# Patient Record
Sex: Female | Born: 1955 | Race: White | Hispanic: No | Marital: Married | Smoking: Never smoker
Health system: Southern US, Community
[De-identification: ages and names within clinical notes are randomized; demographics above are authoritative.]

## PROBLEM LIST (undated history)

## (undated) DIAGNOSIS — J329 Chronic sinusitis, unspecified: Secondary | ICD-10-CM

## (undated) DIAGNOSIS — L509 Urticaria, unspecified: Secondary | ICD-10-CM

## (undated) HISTORY — DX: Urticaria, unspecified: L50.9

## (undated) HISTORY — DX: Chronic sinusitis, unspecified: J32.9

---

## 1997-10-27 ENCOUNTER — Ambulatory Visit (HOSPITAL_COMMUNITY): Admission: RE | Admit: 1997-10-27 | Discharge: 1997-10-27 | Payer: Self-pay | Admitting: *Deleted

## 1997-11-02 ENCOUNTER — Ambulatory Visit (HOSPITAL_COMMUNITY): Admission: RE | Admit: 1997-11-02 | Discharge: 1997-11-02 | Payer: Self-pay | Admitting: *Deleted

## 1997-12-03 ENCOUNTER — Ambulatory Visit (HOSPITAL_COMMUNITY): Admission: RE | Admit: 1997-12-03 | Discharge: 1997-12-03 | Payer: Self-pay | Admitting: *Deleted

## 1997-12-28 ENCOUNTER — Ambulatory Visit (HOSPITAL_BASED_OUTPATIENT_CLINIC_OR_DEPARTMENT_OTHER): Admission: RE | Admit: 1997-12-28 | Discharge: 1997-12-28 | Payer: Self-pay | Admitting: *Deleted

## 1998-07-07 ENCOUNTER — Other Ambulatory Visit: Admission: RE | Admit: 1998-07-07 | Discharge: 1998-07-07 | Payer: Self-pay | Admitting: Gynecology

## 2002-02-20 ENCOUNTER — Other Ambulatory Visit: Admission: RE | Admit: 2002-02-20 | Discharge: 2002-02-20 | Payer: Self-pay | Admitting: Gynecology

## 2004-06-06 ENCOUNTER — Other Ambulatory Visit: Admission: RE | Admit: 2004-06-06 | Discharge: 2004-06-06 | Payer: Self-pay | Admitting: Gynecology

## 2006-10-29 ENCOUNTER — Ambulatory Visit (HOSPITAL_COMMUNITY): Admission: RE | Admit: 2006-10-29 | Discharge: 2006-10-30 | Payer: Self-pay | Admitting: Neurosurgery

## 2006-11-30 ENCOUNTER — Encounter: Admission: RE | Admit: 2006-11-30 | Discharge: 2006-11-30 | Payer: Self-pay | Admitting: Obstetrics and Gynecology

## 2010-10-04 NOTE — Op Note (Signed)
Abigail Fields, Abigail Fields             ACCOUNT NO.:  [id number]   MEDICAL RECORD NO.:  [id number]          PATIENT TYPE:  OIB   LOCATION:  5149                         FACILITY:  [facility]   PHYSICIAN:  Cristi Loron, M.D.DATE OF BIRTH:  08/13/1955   DATE OF PROCEDURE:  10/29/2006  DATE OF DISCHARGE:  10/30/2006                               OPERATIVE REPORT   BRIEF HISTORY:  The patient is a 55 year old white female who has  suffered from neck and right arm pain.  She has failed medical  management.  She is worked up with a cervical MRI, which demonstrated a  large herniated disk at C6-7 on the right.  The patient's signs and  symptoms at physical exam were consistent with a right C7 radiculopathy.  I discussed the various treatment options with the patient including  surgery.  The patient is aware of the risks, benefits and alternatives  of surgery, she decided to proceed with C6-7 anterior cervical  diskectomy and fusion plating, placement of interbody prosthesis.   PREOPERATIVE DIAGNOSES:  1. 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  2. Spinal stenosis.  3. Cervical radiculopathy.  4. Cervicalgia.   POSTOPERATIVE DIAGNOSES:  1. 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  2. Spinal stenosis.  3. Cervical radiculopathy.  4. Cervicalgia.   PROCEDURE:  C6-7 extensive anterior cervical diskectomy/decompression;  C6-7 anterior interbody arthrodesis with local morselized autograft  bone; cervical C6-7 interbody prosthesis (Alphatec PEEK interbody  prosthesis); anterior cervical plating at C6-7 with Codman Slim-Loc  anterior cervical plate and screws).   SURGEON:  Cristi Loron, M.D.   ASSISTANT:  Coletta Memos, M.D.   ANESTHESIA:  General endotracheal.   ESTIMATED BLOOD LOSS:  50 mL.   SPECIMENS:  None.   DRAINS:  None.   COMPLICATIONS:  None.   DESCRIPTION OF PROCEDURE:  The patient is brought to the operating room  by the anesthesia team.  General endotracheal anesthesia was induced.  The patient  remained in the supine position.  A roll was placed under  her shoulders and placed her neck in slight extension.  Her anterior  cervical region was then prepared with Betadine scrub with Betadine  solution and sterile drapes were applied.  I then injected the area to  be incised with Marcaine with epinephrine solution.  I used the scalpel  to make a transverse incision in the patient's left anterior neck.  I  used the Metzenbaum scissors to divide the platysma muscle and then to  dissect medial to sternocleidomastoid muscle to the remaining carotid  artery.  I carefully dissected down towards the anterior cervical spine  carefully identifying the esophagus and retracting it medially.  We then  cleared the soft tissue from the anterior cervical spine and inserted a  spinal needle until exposing the intervertebral disk space.  We then  obtained an intraoperative radiograph to confirm our location.   We then used electrocautery to detach the medial border of the longus  coli muscle bilaterally from C6-7 intervertebral disk space.  We  inserted the [facility] self-retaining retractor underneath the longus coli  muscle bilaterally to provide exposure.  We incised the C6-7  intervertebral disk and performed a partial intravertebral diskectomy  using the pituitary forceps and the Karlin curettes.  We inserted  distractors through the C6 and C7 disk and then distracted the  interspace and then used a high speed drill to decorticate the vertebral  end plates at remainder of C6-7 intervertebral disk to drill away some  posterior spondylosis and to trim out the posterior longitudinal  ligament.  We then incised and trimmed out the ligament with the  arachnoid knife and then removed it with the Kerrison punch and then  cutting the vertebral end plates, decompressing the thecal sac.  We then  performed a foraminotomy and decompression of the nerve roots.  Of note,  we encountered expected large herniated  disk on the right, compressing  the right C7 nerve root.   Having completed decompression at C6-7, we now turned our attention to  the arthrodesis.  We used the trial spacers and determined using 5 mm  small Alphatec interbody PEEK prosthesis.  We prefilled this prosthesis  with the combination of Vitoss bone graft extender and local morselized  autograft bone.  We obtained the decompression.  We then inserted the  prosthesis into the distracted interspace and then removed the  distraction screws.  There was good snug fit of the prosthesis in the  interspace.   We now turned our attention to the anterior spinal instrumentation.  We  used a high speed drill to remove some ventral spondylosis from the  vertebral end plates at Z6-1 so the plate would lay down flat, selecting  the appropriate length with the Codman Slim-Loc anterior cervical plate  and laid it on the anterior aspect of vertebral bodies of C6 and C7.  We  then drilled two 12-mm holes at C6, two at C7 and secured the plate to  the vertebral bodies by placing two 12 mm self-tapping screws at C6 to  C7.  Obtained an intraoperative radiograph but because of the patient's  body habitus, we could not see the plate and screws very well but they  looked good in view though.  We, therefore, secured these screws in  plate by locking each cam, completing the instrumentation.   We then obtained hemostasis using bipolar electrocautery.  We irrigated  the wound out with bacitracin solution and removed the retractor.  We  then inspected the esophagus for any damage, there was none apparent.  We then reapproximated the patient's platysma muscle with interrupted 3-  0 Vicryl sutures, the subcutaneous tissue with interrupted 3-0 Vicryl  suture and the skin with Steri-Strips and benzoin.  The wound was then  coated with bacitracin ointment, a sterile dressing applied, the dressing removed.  The patient was subsequently extubated by the   anesthesia team and transported to the postanesthesia care unit in  stable condition.  All sponge, instrument and needle counts were correct  in this case.      Cristi Loron, M.D.  Electronically Signed     JDJ/MEDQ  D:  10/30/2006  T:  10/31/2006  Job:  [id number]

## 2011-03-09 LAB — CBC
HCT: 45.5
Hemoglobin: 15.4 — ABNORMAL HIGH
MCHC: 33.8
MCV: 93.3
RBC: 4.88

## 2011-07-04 ENCOUNTER — Other Ambulatory Visit: Payer: Self-pay | Admitting: Obstetrics and Gynecology

## 2011-07-04 DIAGNOSIS — N63 Unspecified lump in unspecified breast: Secondary | ICD-10-CM

## 2011-07-11 ENCOUNTER — Other Ambulatory Visit: Payer: Self-pay

## 2011-07-19 ENCOUNTER — Ambulatory Visit
Admission: RE | Admit: 2011-07-19 | Discharge: 2011-07-19 | Disposition: A | Discharge status: Home / Self Care | Payer: Managed Care, Other (non HMO) | Source: Ambulatory Visit | Attending: Obstetrics and Gynecology | Admitting: Obstetrics and Gynecology

## 2011-07-19 DIAGNOSIS — N63 Unspecified lump in unspecified breast: Secondary | ICD-10-CM

## 2014-04-22 ENCOUNTER — Other Ambulatory Visit (HOSPITAL_COMMUNITY): Payer: Self-pay | Admitting: Obstetrics and Gynecology

## 2014-04-22 DIAGNOSIS — R1032 Left lower quadrant pain: Secondary | ICD-10-CM

## 2018-03-25 ENCOUNTER — Other Ambulatory Visit: Payer: Self-pay | Admitting: Obstetrics and Gynecology

## 2018-03-25 DIAGNOSIS — R928 Other abnormal and inconclusive findings on diagnostic imaging of breast: Secondary | ICD-10-CM

## 2018-03-28 ENCOUNTER — Ambulatory Visit
Admission: RE | Admit: 2018-03-28 | Discharge: 2018-03-28 | Disposition: A | Discharge status: Home / Self Care | Payer: Managed Care, Other (non HMO) | Source: Ambulatory Visit | Attending: Obstetrics and Gynecology | Admitting: Obstetrics and Gynecology

## 2018-03-28 ENCOUNTER — Ambulatory Visit: Payer: Managed Care, Other (non HMO)

## 2018-03-28 DIAGNOSIS — R928 Other abnormal and inconclusive findings on diagnostic imaging of breast: Secondary | ICD-10-CM

## 2019-03-12 ENCOUNTER — Encounter: Payer: Self-pay | Admitting: Allergy and Immunology

## 2019-03-12 ENCOUNTER — Other Ambulatory Visit: Payer: Self-pay

## 2019-03-12 ENCOUNTER — Ambulatory Visit (INDEPENDENT_AMBULATORY_CARE_PROVIDER_SITE_OTHER): Payer: Managed Care, Other (non HMO) | Admitting: Allergy and Immunology

## 2019-03-12 VITALS — BP 152/80 | HR 76 | Temp 97.9°F | Resp 16 | Ht 64.0 in | Wt 166.0 lb

## 2019-03-12 DIAGNOSIS — J3089 Other allergic rhinitis: Secondary | ICD-10-CM

## 2019-03-12 DIAGNOSIS — J324 Chronic pansinusitis: Secondary | ICD-10-CM | POA: Diagnosis not present

## 2019-03-12 DIAGNOSIS — T7840XA Allergy, unspecified, initial encounter: Secondary | ICD-10-CM

## 2019-03-12 DIAGNOSIS — B999 Unspecified infectious disease: Secondary | ICD-10-CM

## 2019-03-12 MED ORDER — EPINEPHRINE 0.3 MG/0.3ML IJ SOAJ: 0.3000 mg | INTRAMUSCULAR | 1 refills | Status: AC | PRN

## 2019-03-12 NOTE — Patient Instructions (Addendum)
  1.  Allergen avoidance measures - weeds / lettuce  2.  Treat and prevent inflammation:   A.  OTC budesonide-1 spray each nostril 3 times a week  3.  Treat and prevent reflux:   A.  OTC omeprazole 20 mg - 1 tablet 1 time per day  4.  If needed:   A.  Nasal saline  B.  OTC antihistamine -cetirizine/loratadine  C.  Auvi-Q 0.3, Benadryl, MD/ER evaluation for allergic reaction  5.  Blood - CBC w/diff, IgA/G/M, antipneumococcal antibodies, antitetanus antibody, lettuce IgE.  6.  Return to clinic in 4 weeks or earlier if problem  7.  Obtain fall flu vaccine (and Covid vaccine)

## 2019-03-12 NOTE — Progress Notes (Addendum)
[place] - [place] - [place] - [place] - [place]   NEW PATIENT NOTE  Referring Provider: No ref. provider found Primary Provider: Hal Morales, NP Date of office visit: 03/12/2019    Subjective:   Chief Complaint:  Abigail Fields (DOB: 1955-06-13) is a 63 y.o. female who presents to the clinic on 03/12/2019 with a chief complaint of Allergic Reaction and Urticaria .  HPI: Abigail Fields presents to this clinic in evaluation of airway issues and a recent allergic reaction.  She has a long history of airway issues that have required 2 sinus surgeries with her most recent sinus surgery occurring in May 2017.  She has recurrent bouts of sinus infections that require antibiotic administration a few times per year and most recently she required 30 days of mupirocin nasal wash prescribed about 1 year ago.  She has intermittent bouts of nasal congestion and rhinorrhea and some itchy watery eyes especially if she has outdoor exposure.  She also has a chronic head pressure issue on her right side that never resolves.  She takes Tylenol about 1 or 2 times per week for this issue which does appear to help somewhat.  This headache is pressure-like and is not associated with any scotoma or dizziness or other neurological symptoms.  She has been checked for a CSF fluid leak and apparently that is not an issue.  She has constant postnasal drip and throat clearing.  She had a recent CT scan in 2019 which did identify some level of chronic sinusitis for which she was treated with that mupirocin irrigation.  She tells me that she requires intermittent steroid administration with her most recent steroid administration occurring in August 2020.  In addition, she does get coughing spells in association with her sneezing spells but does not develop any wheezing or shortness of breath.  She does not have a history of ugly sputum production or chest pain or chest tightness.  There is no obvious provoking  factor giving rise to these issues.  She did not have a history of childhood asthma.  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    She has had problems with Nasonex causing epistaxis.  She does have reflux with regurgitation very high in her chest about 1 time per week that she does not treat.  She remains away from caffeine consumption and chocolate consumption.  About 1 week ago she ate a lettuce and [place] and bread sandwich and within 20 minutes developed an intensely itchy throat, sneezing, clear rhinorrhea, trouble breathing, coughing spells that lasted about 15 minutes after consuming of Benadryl.  She had no other associated systemic or constitutional symptoms.  Past Medical History:  Diagnosis Date  . Recurrent sinusitis   . Urticaria     Past Surgical History:  Procedure Laterality Date  . ANKLE SURGERY    . APPENDECTOMY    . CERVICAL SPINE SURGERY     c4-c5 fusion  . SINOSCOPY    . VAGINAL HYSTERECTOMY      Allergies as of 03/12/2019      Reactions   Metronidazole Other (See Comments), Rash   Moxifloxacin Other (See Comments), Rash      Medication List      ANTIHISTAMINE PO Take by mouth. Zyrtec/Claritin/Benadryl   calcium carbonate 1500 (600 Ca) MG Tabs tablet Commonly known as: OSCAL Take 600 mg of elemental calcium by mouth 2 (two) times daily with a meal.  cholecalciferol 25 MCG (1000 UT) tablet Commonly known as: VITAMIN D3 Take 1,000 Units by mouth daily.   metoprolol succinate 50 MG 24 hr tablet Commonly known as: TOPROL-XL Take 50 mg by mouth daily. Take with or immediately following a meal.   VAGIFEM VA Place 0.02 mg vaginally daily.   vitamin C 250 MG tablet Commonly known as: ASCORBIC ACID Take 500 mg by mouth daily.       Review of systems negative except as noted in HPI / PMHx or noted below:  Review of Systems  Constitutional: Negative.   HENT: Negative.   Eyes: Negative.    Respiratory: Negative.   Cardiovascular: Negative.   Gastrointestinal: Negative.   Genitourinary: Negative.   Musculoskeletal: Negative.   Skin: Negative.   Neurological: Negative.   Endo/Heme/Allergies: Negative.   Psychiatric/Behavioral: Negative.     Family History  Problem Relation Age of Onset  . Osteoporosis Mother   . Parkinson's disease Mother   . Heart attack Father   . Stroke Father   . Allergic rhinitis Father     Social History   Socioeconomic History  . Marital status: Married    Spouse name: Not on file  . Number of children: Not on file  . Years of education: Not on file  . Highest education level: Not on file  Occupational History  . Not on file  Social Needs  . Financial resource strain: Not on file  . Food insecurity    Worry: Not on file    Inability: Not on file  . Transportation needs    Medical: Not on file    Non-medical: Not on file  Tobacco Use  . Smoking status: Never Smoker  . Smokeless tobacco: Never Used  Substance and Sexual Activity  . Alcohol use: Never    Frequency: Never  . Drug use: Never  . Sexual activity: Not on file  Lifestyle  . Physical activity    Days per week: Not on file    Minutes per session: Not on file  . Stress: Not on file  Relationships  . Social Musician on phone: Not on file    Gets together: Not on file    Attends religious service: Not on file    Active member of club or organization: Not on file    Attends meetings of clubs or organizations: Not on file    Relationship status: Not on file  . Intimate partner violence    Fear of current or ex partner: Not on file    Emotionally abused: Not on file    Physically abused: Not on file    Forced sexual activity: Not on file  Other Topics Concern  . Not on file  Social History Narrative  . Not on file    Environmental and Social history  Lives in a house with a dry environment, a dog located outside the household, no carpet in the  bedroom, no plastic on the bed, plastic on the pillow, no smoking ongoing with inside the household.  She works as a Engineer, civil (consulting).  Objective:   Vitals:   03/12/19 1341 03/12/19 1505  BP: (!) 164/98 (!) 152/80  Pulse: 76   Resp: 16   Temp: 97.9 F (36.6 C)   SpO2: 98%    Height: 5\' 4"  (162.6 cm) Weight: 166 lb (75.3 kg)  Physical Exam Constitutional:      Appearance: She is not diaphoretic.  HENT:     Head: Normocephalic. No  right periorbital erythema or left periorbital erythema.     Right Ear: Tympanic membrane, ear canal and external ear normal.     Left Ear: Tympanic membrane, ear canal and external ear normal.     Nose: Nose normal. No mucosal edema or rhinorrhea.     Mouth/Throat:     Pharynx: No oropharyngeal exudate.  Eyes:     General: Lids are normal.     Conjunctiva/sclera: Conjunctivae normal.     Pupils: Pupils are equal, round, and reactive to light.  Neck:     Thyroid: No thyromegaly.     Trachea: Trachea normal. No tracheal deviation.  Cardiovascular:     Rate and Rhythm: Normal rate and regular rhythm.     Heart sounds: Normal heart sounds, S1 normal and S2 normal. No murmur.  Pulmonary:     Effort: Pulmonary effort is normal. No respiratory distress.     Breath sounds: No stridor. No wheezing or rales.  Chest:     Chest wall: No tenderness.  Abdominal:     General: There is no distension.     Palpations: Abdomen is soft. There is no mass.     Tenderness: There is no abdominal tenderness. There is no guarding or rebound.  Musculoskeletal:        General: No tenderness.  Lymphadenopathy:     Head:     Right side of head: No tonsillar adenopathy.     Left side of head: No tonsillar adenopathy.     Cervical: No cervical adenopathy.  Skin:    Coloration: Skin is not pale.     Findings: No erythema or rash.     Nails: There is no clubbing.   Neurological:     Mental Status: She is alert.     Diagnostics: Allergy skin tests were performed.  She  demonstrated slight hypersensitivity against weed.  Results of blood tests obtained 16 February 2018 identifies IgE 102 IU/mL, no significant titer of IgE antibodies directed against multiple aeroallergens other than pigweed at 0.93 KU/L and an IgE antibody directed against egg at 0.12 KU/L.  It should be noted that she has never had a problem with consumption of egg.  Results of a sinus CT scan obtained 09 April 2018 identifies the following:  Frontal: Mild mucosal thickening along the frontal sinus floors.  Frontal sinuses are extensively pneumatized. No layering fluid.    Ethmoid: Previous bilateral ethmoidectomy. Mucosal thickening in the  ethmoid sinus regions.    Maxillary: Mucosal thickening along the maxillary sinus floors.  Layering fluid in both maxillary sinuses. Bilateral functional  endoscopic sinus surgery with nasoantral windows.    Sphenoid: Normal    Right ostiomeatal unit: Previous functional endoscopic sinus  surgery. Wide communication.    Left ostiomeatal unit: Previous functional endoscopic sinus surgery.  Wide communication.    Nasal passages: Patent. Intact nasal septum is midline.    Anatomy: Some sinus extension superior to the anterior ethmoid  notches. Those portions show opacification. Symmetric and intact  olfactory grooves and fovea ethmoidalis, Keros II (4-20mm) Sellar  sphenoid pneumatization pattern. No dehiscence of carotid or optic  canals. No onodi cell.    Assessment and Plan:    1. Perennial allergic rhinitis   2. Chronic pansinusitis   3. Recurrent infections   4. Allergic reaction, initial encounter     1.  Allergen avoidance measures -weeds / lettuce  2.  Treat and prevent inflammation:   A.  OTC budesonide-1 spray each nostril 3  times a week  3.  Treat and prevent reflux:   A.  OTC omeprazole 20 mg - 1 tablet 1 time per day  4.  If needed:   A.  Nasal saline  B.  OTC antihistamine  -cetirizine/loratadine  C.  Auvi-Q 0.3, Benadryl, MD/ER evaluation for allergic reaction  5.  Blood - CBC w/diff, IgA/G/M, antipneumococcal antibodies, antitetanus antibody, lettuce IgE.  6.  Return to clinic in 4 weeks or earlier if problem  7.  Obtain fall flu vaccine (and Covid vaccine)  Abigail JoinerRebecca appears to have a dysfunctional upper airway with inflammation and recurrent infection which may be based partially on her atopic phenotype but also may have a contribution from reflux induced respiratory disease and there may also be a defect in her immune system.  We will address these issues by having her consistently use a nasal steroid and start her on therapy for reflux and evaluate her immune system with the blood test noted above.  As well, she does appear to have sensitivity to lettuce consumption which does cross-react with the weed family.  I have given her an injectable epinephrine device to use should she inadvertently consume this family of foods and develop an allergic reaction.  I will see her back in his clinic in 4 weeks or earlier if there is a problem.  Laurette SchimkeEric Kozlow, MD Allergy / Immunology [facility]

## 2019-03-13 ENCOUNTER — Encounter: Payer: Self-pay | Admitting: Allergy and Immunology

## 2019-04-01 ENCOUNTER — Telehealth: Payer: Self-pay

## 2019-04-01 DIAGNOSIS — B999 Unspecified infectious disease: Secondary | ICD-10-CM

## 2019-04-01 NOTE — Telephone Encounter (Signed)
Spoke with patient and informed her of lab results per Dr. Neldon Mc. Dr. Neldon Mc would like patient to get pneumovax and have labs redrawn in 4 weeks. He would like patient to get cbc w diff along with antibodies against pneumococcus. Will order and place labs upfront for pick up.

## 2019-04-02 ENCOUNTER — Encounter: Payer: Self-pay | Admitting: *Deleted

## 2019-04-09 ENCOUNTER — Encounter: Payer: Self-pay | Admitting: Allergy and Immunology

## 2019-04-09 ENCOUNTER — Ambulatory Visit (INDEPENDENT_AMBULATORY_CARE_PROVIDER_SITE_OTHER): Payer: Managed Care, Other (non HMO) | Admitting: Allergy and Immunology

## 2019-04-09 ENCOUNTER — Other Ambulatory Visit: Payer: Self-pay

## 2019-04-09 VITALS — BP 162/92 | HR 83 | Temp 97.1°F | Resp 16

## 2019-04-09 DIAGNOSIS — J324 Chronic pansinusitis: Secondary | ICD-10-CM | POA: Diagnosis not present

## 2019-04-09 DIAGNOSIS — J3089 Other allergic rhinitis: Secondary | ICD-10-CM

## 2019-04-09 DIAGNOSIS — D72820 Lymphocytosis (symptomatic): Secondary | ICD-10-CM | POA: Diagnosis not present

## 2019-04-09 DIAGNOSIS — B999 Unspecified infectious disease: Secondary | ICD-10-CM

## 2019-04-09 DIAGNOSIS — T7840XD Allergy, unspecified, subsequent encounter: Secondary | ICD-10-CM

## 2019-04-09 NOTE — Progress Notes (Signed)
[place]   Follow-up Note  Referring Provider: Charlynn Court, NP Primary Provider: Charlynn Court, NP Date of Office Visit: 04/09/2019  Subjective:   Abigail Fields (DOB: 17-Jun-1955) is a 63 y.o. female who returns to the [facility] [place] on 04/09/2019 in re-evaluation of the following:  HPI: Chelsi returns to this clinic in evaluation of respiratory tract inflammation predominantly involving her upper airways and a history of recurrent infections and a history of allergic reaction possibly directed against lettuce.  Her last visit to this clinic was her initial evaluation 12 March 2019.  She is feeling much better.  Her headaches are pretty much gone, she does not have any coughing spells, she has minimal sneezing, she is now able to smell and taste, her regurgitation has resolved.  She remains away from consuming lettuce and has not had any allergic reactions.  She received the Pneumovax on 02 April 2019.  She also received the flu vaccine.  Allergies as of 04/09/2019      Reactions   Metronidazole Other (See Comments), Rash   Moxifloxacin Other (See Comments), Rash      Medication List    calcium carbonate 1500 (600 Ca) MG Tabs tablet Commonly known as: OSCAL Take 600 mg of elemental calcium by mouth 2 (two) times daily with a meal.   cholecalciferol 25 MCG (1000 UT) tablet Commonly known as: VITAMIN D3 Take 1,000 Units by mouth daily.   EPINEPHrine 0.3 mg/0.3 mL Soaj injection Commonly known as: Auvi-Q Inject 0.3 mLs (0.3 mg total) into the muscle as needed for anaphylaxis.   metoprolol succinate 50 MG 24 hr tablet Commonly known as: TOPROL-XL Take 50 mg by mouth daily. Take with or immediately following a meal.   montelukast 10 MG tablet Commonly known as: SINGULAIR Take 10 mg by mouth daily.   VAGIFEM VA Place 0.02 mg vaginally daily.   vitamin C 250 MG tablet Commonly known as: ASCORBIC  ACID Take 500 mg by mouth daily.       Past Medical History:  Diagnosis Date  . Recurrent sinusitis   . Urticaria     Past Surgical History:  Procedure Laterality Date  . ANKLE SURGERY    . APPENDECTOMY    . CERVICAL SPINE SURGERY     c4-c5 fusion  . SINOSCOPY    . VAGINAL HYSTERECTOMY      Review of systems negative except as noted in HPI / PMHx or noted below:  Review of Systems  Constitutional: Negative.   HENT: Negative.   Eyes: Negative.   Respiratory: Negative.   Cardiovascular: Negative.   Gastrointestinal: Negative.   Genitourinary: Negative.   Musculoskeletal: Negative.   Skin: Negative.   Neurological: Negative.   Endo/Heme/Allergies: Negative.   Psychiatric/Behavioral: Negative.      Objective:   Vitals:   04/09/19 0840  BP: (!) 162/92  Pulse: 83  Resp: 16  Temp: (!) 97.1 F (36.2 C)  SpO2: 97%          Physical Exam Constitutional:      Appearance: She is not diaphoretic.  HENT:     Head: Normocephalic.     Right Ear: Tympanic membrane, ear canal and external ear normal.     Left Ear: Tympanic membrane, ear canal and external ear normal.     Nose: Nose normal. No mucosal edema or rhinorrhea.     Mouth/Throat:     Pharynx: Uvula midline. No  oropharyngeal exudate.  Eyes:     Conjunctiva/sclera: Conjunctivae normal.  Neck:     Thyroid: No thyromegaly.     Trachea: Trachea normal. No tracheal tenderness or tracheal deviation.  Cardiovascular:     Rate and Rhythm: Normal rate and regular rhythm.     Heart sounds: Normal heart sounds, S1 normal and S2 normal. No murmur.  Pulmonary:     Effort: No respiratory distress.     Breath sounds: Normal breath sounds. No stridor. No wheezing or rales.  Lymphadenopathy:     Head:     Right side of head: No tonsillar adenopathy.     Left side of head: No tonsillar adenopathy.     Cervical: No cervical adenopathy.  Skin:    Findings: No erythema or rash.     Nails: There is no clubbing.    Neurological:     Mental Status: She is alert.      Diagnostics:    Results of blood tests obtained 14 March 2019 identified WBC 10.4, absolute eosinophil 100, absolute lymphocyte 3700, hemoglobin 14.2, platelet 266, low levels of antibodies directed against multiple serotypes of pneumococcus, IgG 1122 NG/DL, IgA 476 mg/DL, IgM 546 mg/DL, tetanus antitoxoid IgG antibody greater than 7 IU/mL, lettuce IgE less than 0.10 KU/L  Assessment and Plan:   1. Perennial allergic rhinitis   2. Chronic pansinusitis   3. Recurrent infections   4. Lymphocytosis   5. Allergic reaction, subsequent encounter     1.  Continue to treat and prevent inflammation:   A.  OTC budesonide-1 spray each nostril 3 times a week  2.  Continue to treat and prevent reflux:   A.  OTC omeprazole 20 mg - 1 tablet 1 time per day  3.  If needed:   A.  Nasal saline  B.  OTC antihistamine -cetirizine/loratadine  C.  Auvi-Q 0.3, Benadryl, MD/ER evaluation for allergic reaction  4.  Obtain blood 01 May 2019 - CBC with differential, Pneumo 23 titers  5.  Return to clinic in 12 weeks or earlier if problem  6.  Obtain Covid vaccine when available  Birtie appears to be doing better with her airway inflammation while utilizing a collection of therapy including anti-inflammatory agents for airway and therapy directed against reflux.  She did appear to have a significant antibody deficit directed against pneumococcus and she did get immunized with Pneumovax and will now check to see if she has a good response to that immunization.  As well, she has documented lymphocytosis and will follow that up with a another CBC with differential.  I will see her back in this clinic in 12 weeks or earlier if there is a problem.  Laurette Schimke, MD Allergy / Immunology [facility]

## 2019-04-09 NOTE — Patient Instructions (Addendum)
  1.  Continue to treat and prevent inflammation:   A.  OTC budesonide-1 spray each nostril 3 times a week  2.  Continue to treat and prevent reflux:   A.  OTC omeprazole 20 mg - 1 tablet 1 time per day  3.  If needed:   A.  Nasal saline  B.  OTC antihistamine -cetirizine/loratadine  C.  Auvi-Q 0.3, Benadryl, MD/ER evaluation for allergic reaction  4.  Obtain blood 01 May 2019 - CBC with differential, Pneumo 23 titers  5.  Return to clinic in 12 weeks or earlier if problem  6.  Obtain Covid vaccine when available

## 2019-04-10 ENCOUNTER — Encounter: Payer: Self-pay | Admitting: Allergy and Immunology

## 2019-08-01 ENCOUNTER — Encounter: Payer: Self-pay | Admitting: Allergy and Immunology

## 2020-07-07 ENCOUNTER — Other Ambulatory Visit: Payer: Self-pay | Admitting: Obstetrics and Gynecology

## 2020-07-07 DIAGNOSIS — R928 Other abnormal and inconclusive findings on diagnostic imaging of breast: Secondary | ICD-10-CM

## 2020-07-16 ENCOUNTER — Other Ambulatory Visit: Payer: Self-pay

## 2020-07-16 ENCOUNTER — Ambulatory Visit
Admission: RE | Admit: 2020-07-16 | Discharge: 2020-07-16 | Disposition: A | Discharge status: Home / Self Care | Payer: Managed Care, Other (non HMO) | Source: Ambulatory Visit | Attending: Obstetrics and Gynecology | Admitting: Obstetrics and Gynecology

## 2020-07-16 DIAGNOSIS — R928 Other abnormal and inconclusive findings on diagnostic imaging of breast: Secondary | ICD-10-CM

## 2021-07-31 IMAGING — MG MM DIGITAL DIAGNOSTIC UNILAT*L* W/ TOMO W/ CAD
4 series · 4 of 12 positions shown · non-contrast
Comparison: Previous exam(s).

CLINICAL DATA: The patient was seen for a possible left breast
mass.

EXAM:
DIGITAL DIAGNOSTIC UNILATERAL LEFT MAMMOGRAM WITH TOMOSYNTHESIS AND
CAD; ULTRASOUND LEFT BREAST LIMITED
TECHNIQUE: Left digital diagnostic mammography and breast tomosynthesis was
performed. The images were evaluated with computer-aided detection.;
Targeted ultrasound examination of the left breast was performed

[L ML synth-2D]
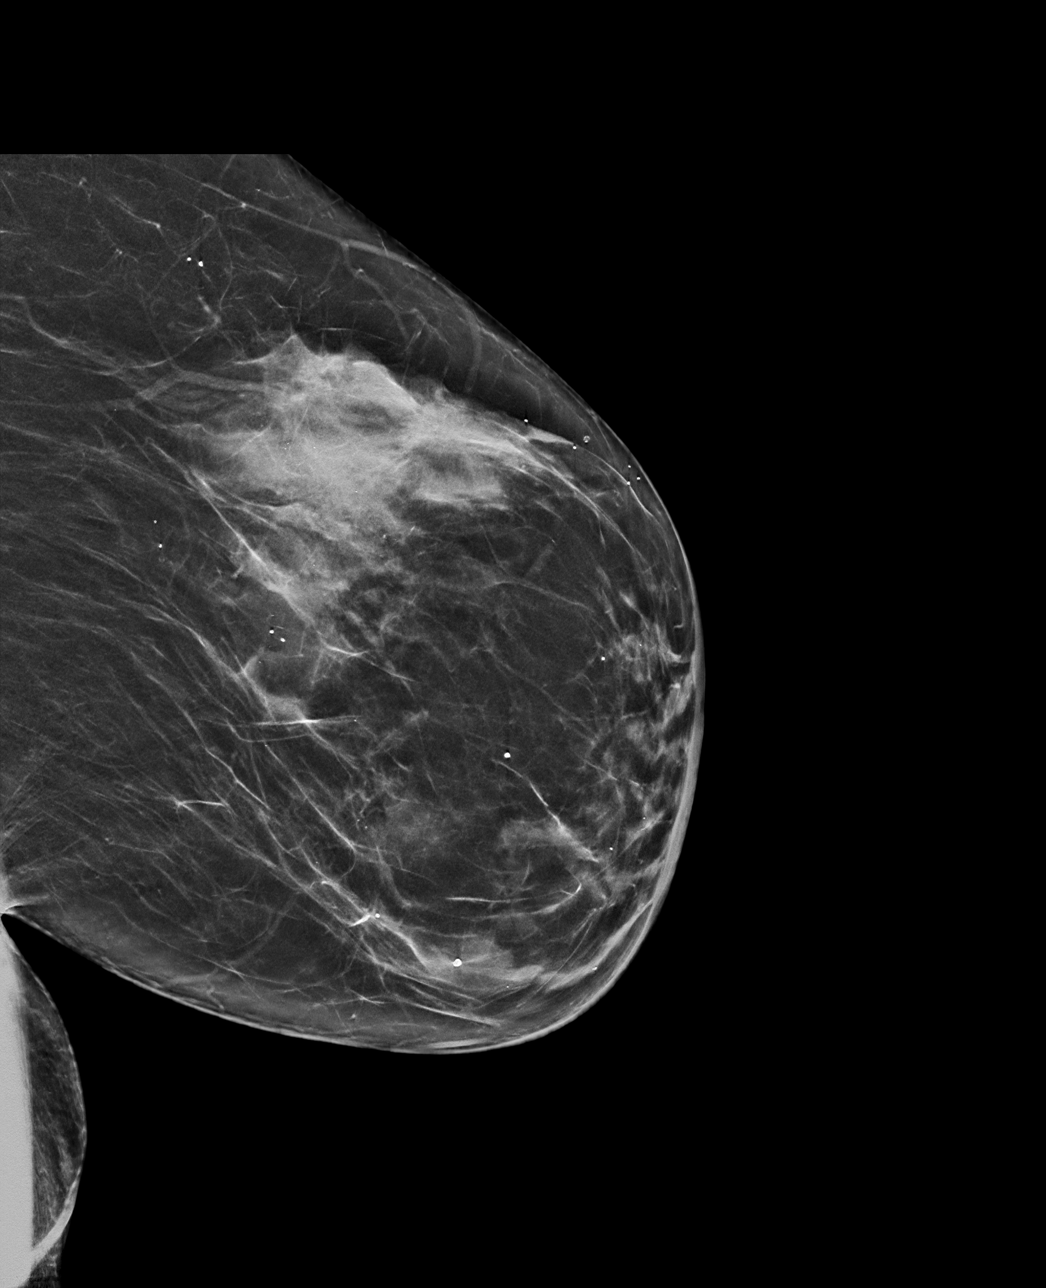

[L CC synth-2D]
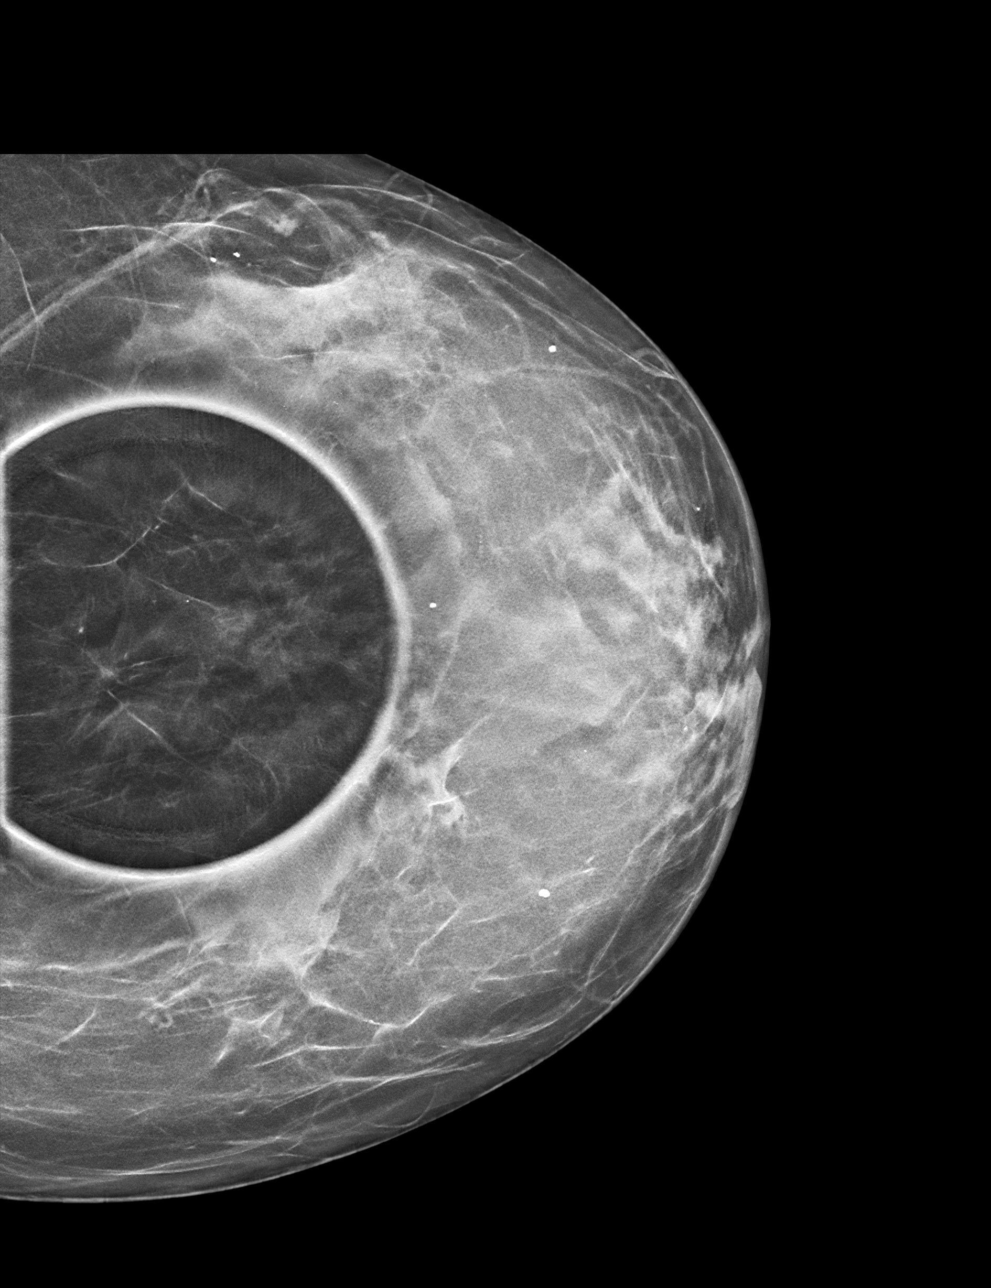

[L CC tomo · tomo slice 23/45.0]
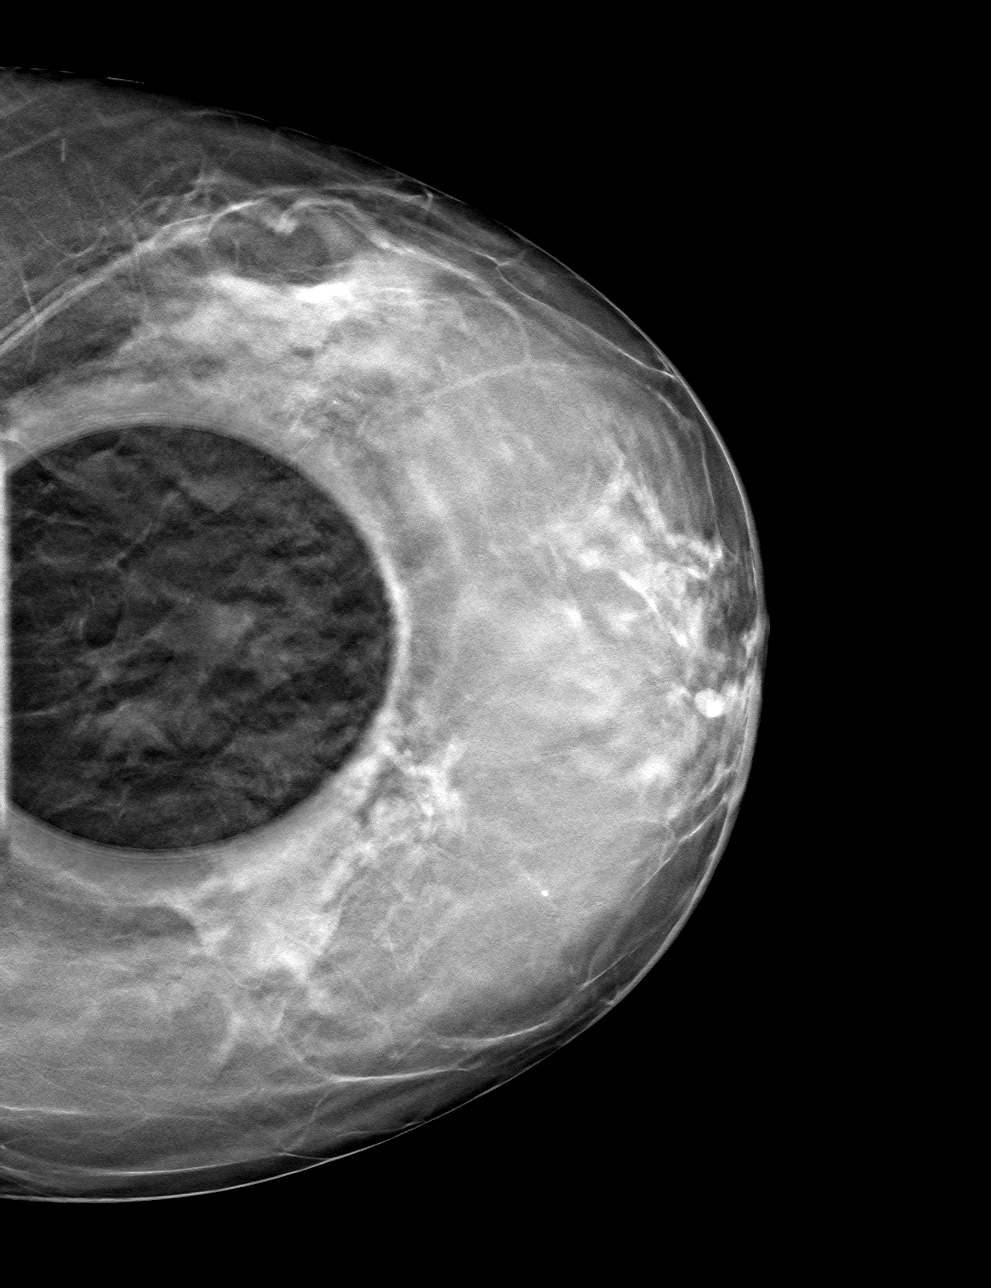

[L ML tomo · tomo slice 33/64.0]
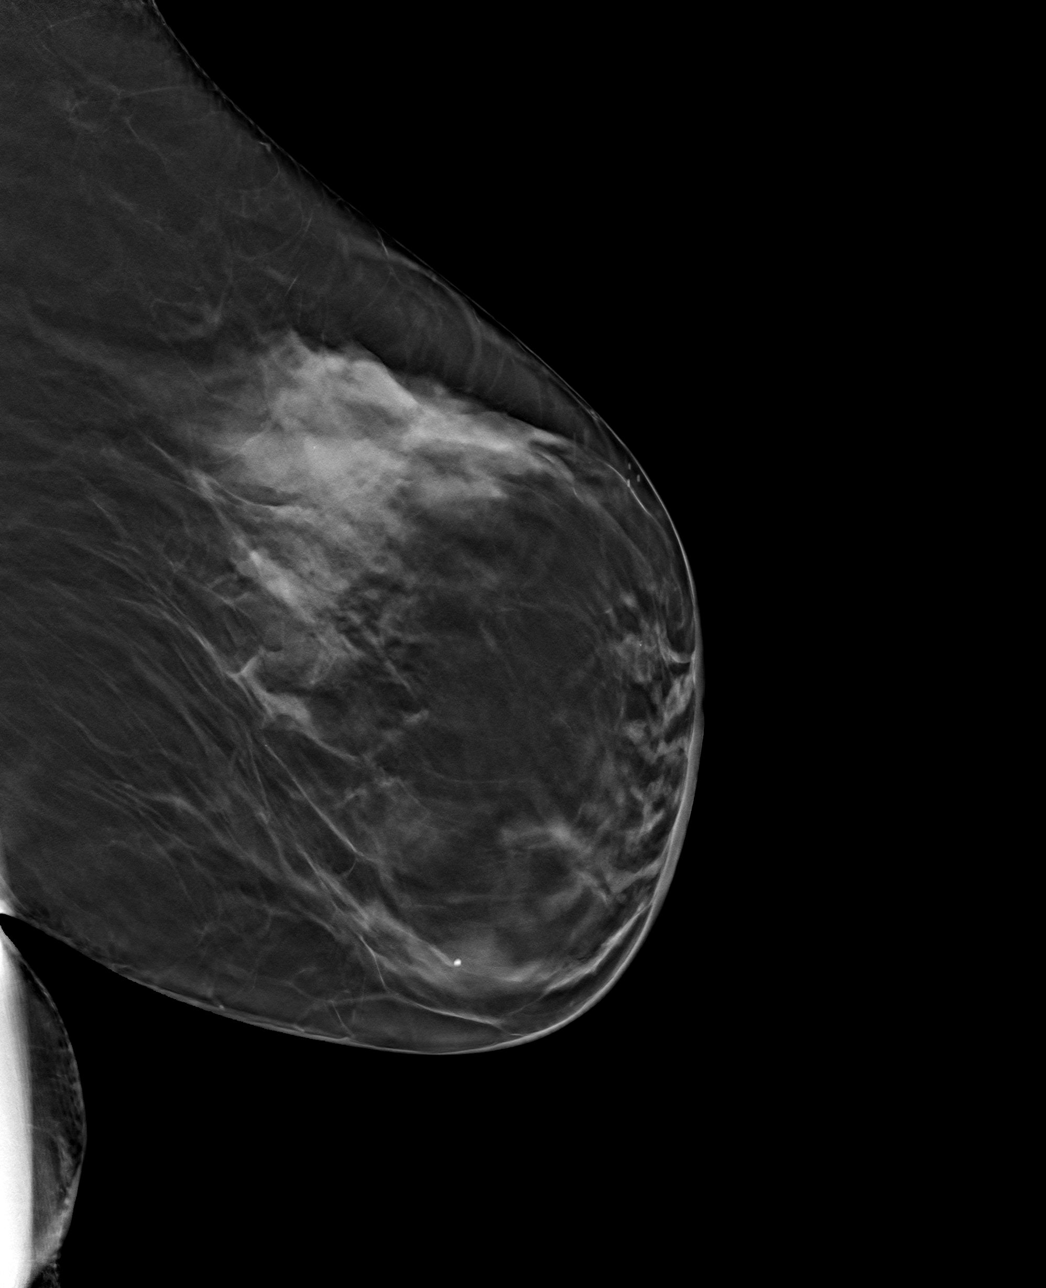

[4 of 12 positions shown; findings below may reference images not displayed]

ACR Breast Density Category b: There are scattered areas of
fibroglandular density.
FINDINGS: Possible mass resolves on today's spot imaging.

On physical exam, no suspicious lumps are identified.

Targeted ultrasound is performed, showing no sonographic
abnormalities in the left breast correlate with the screening
mammography finding.
IMPRESSION: No mammographic or sonographic evidence of malignancy.

RECOMMENDATION:
Annual screening mammography.

I have discussed the findings and recommendations with the patient.
If applicable, a reminder letter will be sent to the patient
regarding the next appointment.

BI-RADS CATEGORY  1: Negative.
